# Patient Record
Sex: Female | Born: 1995
Health system: Southern US, Community
[De-identification: ages and names within clinical notes are randomized; demographics above are authoritative.]

## PROBLEM LIST (undated history)

## (undated) DIAGNOSIS — T783XXA Angioneurotic edema, initial encounter: Secondary | ICD-10-CM

## (undated) DIAGNOSIS — E079 Disorder of thyroid, unspecified: Secondary | ICD-10-CM

## (undated) DIAGNOSIS — L509 Urticaria, unspecified: Secondary | ICD-10-CM

## (undated) DIAGNOSIS — F419 Anxiety disorder, unspecified: Secondary | ICD-10-CM

## (undated) HISTORY — PX: WISDOM TOOTH EXTRACTION: SHX21

## (undated) HISTORY — DX: Urticaria, unspecified: L50.9

## (undated) HISTORY — DX: Angioneurotic edema, initial encounter: T78.3XXA

---
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
## 2016-02-28 ENCOUNTER — Emergency Department (HOSPITAL_COMMUNITY)
Admission: EM | Admit: 2016-02-28 | Discharge: 2016-02-29 | Disposition: A | Discharge status: Home / Self Care | Payer: 59 | Attending: Emergency Medicine | Admitting: Emergency Medicine

## 2016-02-28 ENCOUNTER — Encounter (HOSPITAL_COMMUNITY): Payer: Self-pay

## 2016-02-28 DIAGNOSIS — Y999 Unspecified external cause status: Secondary | ICD-10-CM | POA: Insufficient documentation

## 2016-02-28 DIAGNOSIS — Y939 Activity, unspecified: Secondary | ICD-10-CM | POA: Insufficient documentation

## 2016-02-28 DIAGNOSIS — Y929 Unspecified place or not applicable: Secondary | ICD-10-CM | POA: Insufficient documentation

## 2016-02-28 DIAGNOSIS — W57XXXA Bitten or stung by nonvenomous insect and other nonvenomous arthropods, initial encounter: Secondary | ICD-10-CM | POA: Insufficient documentation

## 2016-02-28 DIAGNOSIS — T22212A Burn of second degree of left forearm, initial encounter: Secondary | ICD-10-CM | POA: Insufficient documentation

## 2016-02-28 DIAGNOSIS — T2220XA Burn of second degree of shoulder and upper limb, except wrist and hand, unspecified site, initial encounter: Secondary | ICD-10-CM

## 2016-02-28 DIAGNOSIS — S50361A Insect bite (nonvenomous) of right elbow, initial encounter: Secondary | ICD-10-CM | POA: Insufficient documentation

## 2016-02-28 DIAGNOSIS — E039 Hypothyroidism, unspecified: Secondary | ICD-10-CM | POA: Insufficient documentation

## 2016-02-28 DIAGNOSIS — F17210 Nicotine dependence, cigarettes, uncomplicated: Secondary | ICD-10-CM | POA: Insufficient documentation

## 2016-02-28 MED ORDER — CEFUROXIME AXETIL 500 MG PO TABS: 500.0000 mg | ORAL_TABLET | Freq: Two times a day (BID) | ORAL | 0 refills | Status: DC

## 2016-02-28 MED ORDER — CEPHALEXIN 500 MG PO CAPS
500.0000 mg | ORAL_CAPSULE | Freq: Once | ORAL | Status: AC
  Administered 2016-02-28: 500 mg via ORAL
  Filled 2016-02-28: qty 1

## 2016-02-28 NOTE — Discharge Instructions (Signed)
Cleanse the burn wound with soap and water. Apply a Band-Aid for protection. Ceftin 2 times daily with food. Use benadryl cream if needed for itching.

## 2016-02-28 NOTE — ED Triage Notes (Signed)
Patient reports of waking up with insect bite to right forearm. Redness and heat noted. Also states she feels like she "cant get a full breath". Nad noted.

## 2016-02-28 NOTE — ED Notes (Signed)
Pt states a bug bite to the right forearm she noticed today. Redness noted to the right forearm, no drainage noted.

## 2016-02-29 NOTE — ED Notes (Signed)
Pt alert & oriented x4, stable gait. Patient  given discharge instructions, paperwork & prescription(s). Patient verbalized understanding. Pt left department w/ no further questions. 

## 2016-02-29 NOTE — ED Provider Notes (Signed)
[facility]-EMER[facility]PT Provider Note   CSN: [id number] Arrival date & time: 02/28/16  2204     History   Chief Complaint Chief Complaint  Patient presents with  . Insect Bite    HPI Donna Harmon is a 20 y.o. female.  Patient is a 20 year old female who presents to the emergency department with complaint of a insect bite to the right arm, and a burn to the left arm.  The patient states she sustained a burn to the left wrist 2 days ago. She sustained a bite to the right elbow area one day ago. She notices increased redness and swelling at the site of the bite and is concerned about infection area the patient states that prior to her arrival to the emergency department she felt as though she couldn't get a full breath and she became even more concerned. She's not had fever or chills reported. Patient presents now for assistance with these issues. Patient states that she is up-to-date on her tetanus.   The history is provided by the patient.    History reviewed. No pertinent past medical history.  Patient Active Problem List   Diagnosis Date Noted  . HYPOTHYROIDISM 08/01/2008  . THYROIDITIS 08/01/2008    History reviewed. No pertinent surgical history.  OB History    No data available       Home Medications    Prior to Admission medications   Medication Sig Start Date End Date Taking? Authorizing Provider  cefUROXime (CEFTIN) 500 MG tablet Take 1 tablet (500 mg total) by mouth 2 (two) times daily with a meal. 02/28/16   Ivery QualeHobson Rozelia Catapano, PA-C  predniSONE (DELTASONE) 50 MG tablet One tablet PO daily for 4 days 06/15/14   Zadie Rhineonald Wickline, MD    Family History No family history on file.  Social History Social History  Substance Use Topics  . Smoking status: Current Every Day Smoker    Packs/day: 0.50    Types: Cigarettes  . Smokeless tobacco: Never Used  . Alcohol use No     Allergies   Shrimp [shellfish allergy]   Review of Systems Review of Systems    Constitutional: Negative for activity change.       All ROS Neg except as noted in HPI  HENT: Negative for nosebleeds.   Eyes: Negative for photophobia and discharge.  Respiratory: Negative for cough, shortness of breath and wheezing.   Cardiovascular: Negative for chest pain and palpitations.  Gastrointestinal: Negative for abdominal pain and blood in stool.  Genitourinary: Negative for dysuria, frequency and hematuria.  Musculoskeletal: Negative for arthralgias, back pain and neck pain.  Skin: Negative.   Neurological: Negative for dizziness, seizures and speech difficulty.  Psychiatric/Behavioral: Negative for confusion and hallucinations.     Physical Exam Updated Vital Signs BP 107/67 (BP Location: Left Arm)   Pulse 71   Temp 98.6 F (37 C) (Oral)   Resp 20   Ht 5\' 4"  (1.626 m)   Wt 83.9 kg   LMP 02/25/2016   SpO2 100%   BMI 31.76 kg/m   Physical Exam  Constitutional: She is oriented to person, place, and time. She appears well-developed and well-nourished.  Non-toxic appearance.  HENT:  Head: Normocephalic.  Right Ear: Tympanic membrane and external ear normal.  Left Ear: Tympanic membrane and external ear normal.  Eyes: EOM and lids are normal. Pupils are equal, round, and reactive to light.  Neck: Normal range of motion. Neck supple. Carotid bruit is not present.  Cardiovascular: Normal rate,  regular rhythm, normal heart sounds, intact distal pulses and normal pulses.   Pulmonary/Chest: Breath sounds normal. No respiratory distress. She has no wheezes. She has no rales.  Abdominal: Soft. Bowel sounds are normal. There is no tenderness. There is no guarding.  Musculoskeletal: Normal range of motion.  There is a scabbed 2nd degree burn of the palmar surface of the left forearm. There is a red raised area of the right elbow ulnar surface. No red streaks. The area is warm but not hot. FROM of the right elbow. No palpable nodes of the right or left bicep/tricep area.   Lymphadenopathy:       Head (right side): No submandibular adenopathy present.       Head (left side): No submandibular adenopathy present.    She has no cervical adenopathy.  Neurological: She is alert and oriented to person, place, and time. She has normal strength. No cranial nerve deficit or sensory deficit.  Skin: Skin is warm and dry.  Psychiatric: She has a normal mood and affect. Her speech is normal.  Nursing note and vitals reviewed.    ED Treatments / Results  Labs (all labs ordered are listed, but only abnormal results are displayed) Labs Reviewed - No data to display  EKG  EKG Interpretation None       Radiology No results found.  Procedures Procedures (including critical care time)  Medications Ordered in ED Medications  cephALEXin (KEFLEX) capsule 500 mg (500 mg Oral Given 02/28/16 2359)     Initial Impression / Assessment and Plan / ED Course  I have reviewed the triage vital signs and the nursing notes.  Pertinent labs & imaging results that were available during my care of the patient were reviewed by me and considered in my medical decision making (see chart for details).  Clinical Course    *I have reviewed nursing notes, vital signs, and all appropriate lab and imaging results for this patient.**  Final Clinical Impressions(s) / ED Diagnoses  Pt sustained a burn to the left wrist area that is healing nicely. Pt has red raised warm insect bite of the right elbow. Pt to be treated with ceftin. Pt will use benadryl cream for itching if needed. Pt to return to ED if any changes or problem.   Final diagnoses:  Insect bite  Second degree burn of arm, initial encounter    New Prescriptions New Prescriptions   CEFUROXIME (CEFTIN) 500 MG TABLET    Take 1 tablet (500 mg total) by mouth 2 (two) times daily with a meal.     Ivery Quale, PA-C 02/29/16 0022    Devoria Albe, MD 02/29/16 [phone]

## 2016-04-09 ENCOUNTER — Ambulatory Visit: Payer: Self-pay | Admitting: Allergy & Immunology

## 2016-05-21 ENCOUNTER — Ambulatory Visit: Payer: 59 | Admitting: Allergy & Immunology

## 2016-08-22 DIAGNOSIS — R05 Cough: Secondary | ICD-10-CM | POA: Diagnosis not present

## 2016-08-22 DIAGNOSIS — J069 Acute upper respiratory infection, unspecified: Secondary | ICD-10-CM | POA: Diagnosis not present

## 2016-09-23 DIAGNOSIS — M9903 Segmental and somatic dysfunction of lumbar region: Secondary | ICD-10-CM | POA: Diagnosis not present

## 2016-09-23 DIAGNOSIS — M546 Pain in thoracic spine: Secondary | ICD-10-CM | POA: Diagnosis not present

## 2016-09-23 DIAGNOSIS — M9902 Segmental and somatic dysfunction of thoracic region: Secondary | ICD-10-CM | POA: Diagnosis not present

## 2016-09-25 DIAGNOSIS — M9902 Segmental and somatic dysfunction of thoracic region: Secondary | ICD-10-CM | POA: Diagnosis not present

## 2016-09-25 DIAGNOSIS — M546 Pain in thoracic spine: Secondary | ICD-10-CM | POA: Diagnosis not present

## 2016-09-25 DIAGNOSIS — M9903 Segmental and somatic dysfunction of lumbar region: Secondary | ICD-10-CM | POA: Diagnosis not present

## 2016-09-27 DIAGNOSIS — M9903 Segmental and somatic dysfunction of lumbar region: Secondary | ICD-10-CM | POA: Diagnosis not present

## 2016-09-27 DIAGNOSIS — M546 Pain in thoracic spine: Secondary | ICD-10-CM | POA: Diagnosis not present

## 2016-09-27 DIAGNOSIS — M9902 Segmental and somatic dysfunction of thoracic region: Secondary | ICD-10-CM | POA: Diagnosis not present

## 2016-09-30 DIAGNOSIS — M9902 Segmental and somatic dysfunction of thoracic region: Secondary | ICD-10-CM | POA: Diagnosis not present

## 2016-09-30 DIAGNOSIS — M546 Pain in thoracic spine: Secondary | ICD-10-CM | POA: Diagnosis not present

## 2016-09-30 DIAGNOSIS — M9903 Segmental and somatic dysfunction of lumbar region: Secondary | ICD-10-CM | POA: Diagnosis not present

## 2016-10-30 ENCOUNTER — Other Ambulatory Visit (HOSPITAL_COMMUNITY): Payer: Self-pay | Admitting: Preventative Medicine

## 2016-10-30 ENCOUNTER — Ambulatory Visit (HOSPITAL_COMMUNITY)
Admission: RE | Admit: 2016-10-30 | Discharge: 2016-10-30 | Disposition: A | Discharge status: Home / Self Care | Payer: Commercial Managed Care - HMO | Source: Ambulatory Visit | Attending: Preventative Medicine | Admitting: Preventative Medicine

## 2016-10-30 DIAGNOSIS — R109 Unspecified abdominal pain: Secondary | ICD-10-CM | POA: Diagnosis not present

## 2016-10-30 DIAGNOSIS — R1031 Right lower quadrant pain: Secondary | ICD-10-CM | POA: Insufficient documentation

## 2016-10-30 DIAGNOSIS — R935 Abnormal findings on diagnostic imaging of other abdominal regions, including retroperitoneum: Secondary | ICD-10-CM | POA: Diagnosis not present

## 2016-10-30 MED ORDER — IOPAMIDOL (ISOVUE-300) INJECTION 61%
100.0000 mL | Freq: Once | INTRAVENOUS | Status: AC | PRN
  Administered 2016-10-30: 100 mL via INTRAVENOUS

## 2016-11-06 DIAGNOSIS — Z01419 Encounter for gynecological examination (general) (routine) without abnormal findings: Secondary | ICD-10-CM | POA: Diagnosis not present

## 2016-11-15 DIAGNOSIS — Z0389 Encounter for observation for other suspected diseases and conditions ruled out: Secondary | ICD-10-CM | POA: Diagnosis not present

## 2016-12-11 DIAGNOSIS — E039 Hypothyroidism, unspecified: Secondary | ICD-10-CM | POA: Diagnosis not present

## 2016-12-27 ENCOUNTER — Emergency Department (HOSPITAL_COMMUNITY)
Admission: EM | Admit: 2016-12-27 | Discharge: 2016-12-27 | Disposition: A | Discharge status: Home / Self Care | Payer: 59 | Attending: Emergency Medicine | Admitting: Emergency Medicine

## 2016-12-27 ENCOUNTER — Encounter (HOSPITAL_COMMUNITY): Payer: Self-pay | Admitting: *Deleted

## 2016-12-27 DIAGNOSIS — Y93K9 Activity, other involving animal care: Secondary | ICD-10-CM | POA: Insufficient documentation

## 2016-12-27 DIAGNOSIS — E039 Hypothyroidism, unspecified: Secondary | ICD-10-CM | POA: Diagnosis not present

## 2016-12-27 DIAGNOSIS — S61250A Open bite of right index finger without damage to nail, initial encounter: Secondary | ICD-10-CM | POA: Diagnosis not present

## 2016-12-27 DIAGNOSIS — Z23 Encounter for immunization: Secondary | ICD-10-CM | POA: Diagnosis not present

## 2016-12-27 DIAGNOSIS — F17210 Nicotine dependence, cigarettes, uncomplicated: Secondary | ICD-10-CM | POA: Diagnosis not present

## 2016-12-27 DIAGNOSIS — W5311XA Bitten by rat, initial encounter: Secondary | ICD-10-CM | POA: Insufficient documentation

## 2016-12-27 DIAGNOSIS — Y92410 Unspecified street and highway as the place of occurrence of the external cause: Secondary | ICD-10-CM | POA: Insufficient documentation

## 2016-12-27 DIAGNOSIS — Z79899 Other long term (current) drug therapy: Secondary | ICD-10-CM | POA: Diagnosis not present

## 2016-12-27 DIAGNOSIS — Y999 Unspecified external cause status: Secondary | ICD-10-CM | POA: Insufficient documentation

## 2016-12-27 DIAGNOSIS — S61431A Puncture wound without foreign body of right hand, initial encounter: Secondary | ICD-10-CM | POA: Insufficient documentation

## 2016-12-27 HISTORY — DX: Disorder of thyroid, unspecified: E07.9

## 2016-12-27 MED ORDER — AMOXICILLIN-POT CLAVULANATE 875-125 MG PO TABS
1.0000 | ORAL_TABLET | Freq: Once | ORAL | Status: AC
  Administered 2016-12-27: 1 via ORAL
  Filled 2016-12-27: qty 1

## 2016-12-27 MED ORDER — AMOXICILLIN-POT CLAVULANATE 875-125 MG PO TABS: 1.0000 | ORAL_TABLET | Freq: Two times a day (BID) | ORAL | 0 refills | Status: DC

## 2016-12-27 MED ORDER — TETANUS-DIPHTH-ACELL PERTUSSIS 5-2.5-18.5 LF-MCG/0.5 IM SUSP
0.5000 mL | Freq: Once | INTRAMUSCULAR | Status: AC
  Administered 2016-12-27: 0.5 mL via INTRAMUSCULAR
  Filled 2016-12-27: qty 0.5

## 2016-12-27 NOTE — ED Triage Notes (Signed)
Pt was bit on her right index finger by a "feeder rat" that she was feeding to her snake.

## 2016-12-27 NOTE — ED Provider Notes (Signed)
[facility] Provider Note   CSN: [id number] Arrival date & time: 12/27/16  2107     History   Chief Complaint Chief Complaint  Patient presents with  . Animal Bite    HPI Donna Harmon is a 21 y.o. female presenting for evaluation of rat bite to her right index finger.  She had purchased "feeder rats" to feed her snake from a pet store in [place] and driving home one got out of the box and bit her finger. She has one puncture wound that bled copiously.  She cleaned it with rubbing alcohol prior to arriving here.  She is concerned about possible infection.  She is unsure of her tetanus status.  .  The history is provided by the patient.    Past Medical History:  Diagnosis Date  . Thyroid disease     Patient Active Problem List   Diagnosis Date Noted  . HYPOTHYROIDISM 08/01/2008  . THYROIDITIS 08/01/2008    History reviewed. No pertinent surgical history.  OB History    No data available       Home Medications    Prior to Admission medications   Medication Sig Start Date End Date Taking? Authorizing Provider  amoxicillin-clavulanate (AUGMENTIN) 875-125 MG tablet Take 1 tablet by mouth every 12 (twelve) hours. 12/27/16   Burgess AmorIdol, Hillis Mcphatter, PA-C  cefUROXime (CEFTIN) 500 MG tablet Take 1 tablet (500 mg total) by mouth 2 (two) times daily with a meal. 02/28/16   Ivery QualeBryant, Hobson, PA-C  predniSONE (DELTASONE) 50 MG tablet One tablet PO daily for 4 days 06/15/14   Zadie RhineWickline, Donald, MD    Family History History reviewed. No pertinent family history.  Social History Social History  Substance Use Topics  . Smoking status: Current Every Day Smoker    Packs/day: 0.50    Types: Cigarettes  . Smokeless tobacco: Never Used  . Alcohol use No     Allergies   Shrimp [shellfish allergy]   Review of Systems Review of Systems  Constitutional: Negative for chills and fever.  Respiratory: Negative for shortness of breath and wheezing.   Skin: Positive for wound.    Neurological: Negative for numbness.     Physical Exam Updated Vital Signs BP 119/78   Pulse 97   Temp 97.7 F (36.5 C) (Oral)   Resp 17   Ht 5\' 4"  (1.626 m)   Wt 81.6 kg (180 lb)   SpO2 99%   BMI 30.90 kg/m   Physical Exam  Constitutional: She is oriented to person, place, and time. She appears well-developed and well-nourished.  HENT:  Head: Normocephalic.  Cardiovascular: Normal rate.   Pulmonary/Chest: Effort normal.  Musculoskeletal: She exhibits no tenderness.  FROM of fingers. No ttp of affected finger.  Neurological: She is alert and oriented to person, place, and time. No sensory deficit.  Skin:  Tiny puncture wound right distal index finger, hemostatic.     ED Treatments / Results  Labs (all labs ordered are listed, but only abnormal results are displayed) Labs Reviewed - No data to display  EKG  EKG Interpretation None       Radiology No results found.  Procedures Procedures (including critical care time)  Medications Ordered in ED Medications  Tdap (BOOSTRIX) injection 0.5 mL (0.5 mLs Intramuscular Given 12/27/16 2158)  amoxicillin-clavulanate (AUGMENTIN) 875-125 MG per tablet 1 tablet (1 tablet Oral Given 12/27/16 2157)     Initial Impression / Assessment and Plan / ED Course  I have reviewed the triage vital signs  and the nursing notes.  Pertinent labs & imaging results that were available during my care of the patient were reviewed by me and considered in my medical decision making (see chart for details).     Pt will be covered with augmentin given animal bite. Tetanus updated.  PRN f/u anticipated.  No indication to consider rabies as rats do not harbor per cdc review.  Final Clinical Impressions(s) / ED Diagnoses   Final diagnoses:  Rat bite, initial encounter    New Prescriptions New Prescriptions   AMOXICILLIN-CLAVULANATE (AUGMENTIN) 875-125 MG TABLET    Take 1 tablet by mouth every 12 (twelve) hours.     Hoyle Barkdull,  PA-C 12/27/16 23Burgess [id number]    Donnetta Hutchingook, Brian, MD 01/02/17 [phone]

## 2017-01-09 DIAGNOSIS — E039 Hypothyroidism, unspecified: Secondary | ICD-10-CM | POA: Diagnosis not present

## 2017-01-10 DIAGNOSIS — Z048 Encounter for examination and observation for other specified reasons: Secondary | ICD-10-CM | POA: Diagnosis not present

## 2017-01-17 DIAGNOSIS — Z0100 Encounter for examination of eyes and vision without abnormal findings: Secondary | ICD-10-CM | POA: Diagnosis not present

## 2017-01-21 ENCOUNTER — Encounter: Payer: 59 | Admitting: Adult Health

## 2017-01-28 ENCOUNTER — Telehealth: Payer: Self-pay | Admitting: *Deleted

## 2017-01-28 NOTE — Telephone Encounter (Signed)
Pt called in with some gyn problems.  Pt given appointment for problem since we have never seen her before.  01-28-17  AS

## 2017-01-29 DIAGNOSIS — N912 Amenorrhea, unspecified: Secondary | ICD-10-CM | POA: Diagnosis not present

## 2017-02-10 ENCOUNTER — Other Ambulatory Visit (HOSPITAL_COMMUNITY): Payer: Self-pay | Admitting: Obstetrics and Gynecology

## 2017-02-10 ENCOUNTER — Encounter: Payer: 59 | Admitting: Adult Health

## 2017-02-10 DIAGNOSIS — Z3141 Encounter for fertility testing: Secondary | ICD-10-CM

## 2017-02-14 ENCOUNTER — Ambulatory Visit (HOSPITAL_COMMUNITY): Admission: RE | Admit: 2017-02-14 | Payer: 59 | Source: Ambulatory Visit

## 2017-02-25 ENCOUNTER — Emergency Department (HOSPITAL_COMMUNITY): Payer: 59

## 2017-02-25 ENCOUNTER — Emergency Department (HOSPITAL_COMMUNITY)
Admission: EM | Admit: 2017-02-25 | Discharge: 2017-02-25 | Disposition: A | Discharge status: Home / Self Care | Payer: 59 | Attending: Emergency Medicine | Admitting: Emergency Medicine

## 2017-02-25 ENCOUNTER — Encounter (HOSPITAL_COMMUNITY): Payer: Self-pay | Admitting: *Deleted

## 2017-02-25 DIAGNOSIS — R0600 Dyspnea, unspecified: Secondary | ICD-10-CM | POA: Diagnosis not present

## 2017-02-25 DIAGNOSIS — Z79899 Other long term (current) drug therapy: Secondary | ICD-10-CM | POA: Insufficient documentation

## 2017-02-25 DIAGNOSIS — R0602 Shortness of breath: Secondary | ICD-10-CM | POA: Diagnosis not present

## 2017-02-25 DIAGNOSIS — E039 Hypothyroidism, unspecified: Secondary | ICD-10-CM | POA: Insufficient documentation

## 2017-02-25 DIAGNOSIS — F17210 Nicotine dependence, cigarettes, uncomplicated: Secondary | ICD-10-CM | POA: Diagnosis not present

## 2017-02-25 HISTORY — DX: Anxiety disorder, unspecified: F41.9

## 2017-02-25 LAB — CBC WITH DIFFERENTIAL/PLATELET
BASOS ABS: 0 10*3/uL (ref 0.0–0.1)
BASOS PCT: 0 %
EOS ABS: 0.3 10*3/uL (ref 0.0–0.7)
EOS PCT: 3 %
HCT: 43.8 % (ref 36.0–46.0)
Hemoglobin: 14.6 g/dL (ref 12.0–15.0)
Lymphocytes Relative: 42 %
Lymphs Abs: 4 10*3/uL (ref 0.7–4.0)
MCH: 30.6 pg (ref 26.0–34.0)
MCHC: 33.3 g/dL (ref 30.0–36.0)
MCV: 91.8 fL (ref 78.0–100.0)
Monocytes Absolute: 1 10*3/uL (ref 0.1–1.0)
Monocytes Relative: 10 %
Neutro Abs: 4.3 10*3/uL (ref 1.7–7.7)
Neutrophils Relative %: 45 %
PLATELETS: 278 10*3/uL (ref 150–400)
RBC: 4.77 MIL/uL (ref 3.87–5.11)
RDW: 12.6 % (ref 11.5–15.5)
WBC: 9.5 10*3/uL (ref 4.0–10.5)

## 2017-02-25 LAB — D-DIMER, QUANTITATIVE (NOT AT ARMC)

## 2017-02-25 LAB — BASIC METABOLIC PANEL
Anion gap: 8 (ref 5–15)
BUN: 9 mg/dL (ref 6–20)
CO2: 26 mmol/L (ref 22–32)
Calcium: 9.4 mg/dL (ref 8.9–10.3)
Chloride: 105 mmol/L (ref 101–111)
Creatinine, Ser: 0.77 mg/dL (ref 0.44–1.00)
GFR calc Af Amer: >60 mL/min (ref >60)
GLUCOSE: 112 mg/dL — AB (ref 65–99)
POTASSIUM: 3.4 mmol/L — AB (ref 3.5–5.1)
Sodium: 139 mmol/L (ref 135–145)

## 2017-02-25 LAB — HCG, SERUM, QUALITATIVE: Preg, Serum: NEGATIVE

## 2017-02-25 NOTE — ED Triage Notes (Signed)
Pt c/o some tightness to her throat that started last night and has gotten progressively worse; pt states she has not taken her lexapro in two days

## 2017-02-25 NOTE — Discharge Instructions (Signed)
Continue your medications as previously prescribed.  Return to the emergency department if your symptoms significantly worsen or change.

## 2017-02-25 NOTE — ED Provider Notes (Signed)
[facility] DEPT Provider Note   CSN: [id number] Arrival date & time: 02/25/17  0152     History   Chief Complaint Chief Complaint  Patient presents with  . Shortness of Breath    HPI Donna Harmon is a 21 y.o. female.  Patient is a 21 year old female with past history of anxiety. She presents today for evaluation of shortness of breath. She had an episode yesterday evening, and another one again this evening. She states she was lying in bed when she developed the sensation that she could not get enough air. She denies any chest pain. She denies any leg pain or swelling. She denies any fevers, chills, or productive cough.   The history is provided by the patient.  Shortness of Breath  This is a new problem. The average episode lasts 2 hours. The problem occurs continuously.Pertinent negatives include no fever, no cough, no sputum production, no chest pain, no leg pain and no leg swelling. She has tried nothing for the symptoms. Associated medical issues do not include asthma, COPD, PE or DVT.    Past Medical History:  Diagnosis Date  . Anxiety   . Thyroid disease     Patient Active Problem List   Diagnosis Date Noted  . HYPOTHYROIDISM 08/01/2008  . THYROIDITIS 08/01/2008    Past Surgical History:  Procedure Laterality Date  . WISDOM TOOTH EXTRACTION      OB History    No data available       Home Medications    Prior to Admission medications   Medication Sig Start Date End Date Taking? Authorizing Provider  escitalopram (LEXAPRO) 10 MG tablet Take 10 mg by mouth daily.   Yes [provider]  levothyroxine (SYNTHROID, LEVOTHROID) 75 MCG tablet Take 75 mcg by mouth daily before breakfast.   Yes [provider]    Family History History reviewed. No pertinent family history.  Social History Social History  Substance Use Topics  . Smoking status: Current Every Day Smoker    Packs/day: 0.50    Types: Cigarettes  . Smokeless tobacco:  Never Used  . Alcohol use No     Allergies   Shrimp [shellfish allergy]   Review of Systems Review of Systems  Constitutional: Negative for fever.  Respiratory: Positive for shortness of breath. Negative for cough and sputum production.   Cardiovascular: Negative for chest pain and leg swelling.  All other systems reviewed and are negative.    Physical Exam Updated Vital Signs BP 124/73 (BP Location: Left Arm)   Pulse 83   Temp 97.6 F (36.4 C) (Oral)   Resp 18   Ht  (1.626 m)   Wt 85.3 kg (188 lb)   LMP 02/14/2017   SpO2 98%   BMI 32.27 kg/m   Physical Exam  Constitutional: She is oriented to person, place, and time. She appears well-developed and well-nourished. No distress.  HENT:  Head: Normocephalic and atraumatic.  Neck: Normal range of motion. Neck supple.  Cardiovascular: Normal rate and regular rhythm.  Exam reveals no gallop and no friction rub.   No murmur heard. Pulmonary/Chest: Effort normal and breath sounds normal. No respiratory distress. She has no wheezes.  Abdominal: Soft. Bowel sounds are normal. She exhibits no distension. There is no tenderness.  Musculoskeletal: Normal range of motion. She exhibits no edema.  There is no calf swelling or tenderness. Homans sign is absent bilaterally.  Neurological: She is alert and oriented to person, place, and time.  Skin: Skin  is warm and dry. She is not diaphoretic.  Nursing note and vitals reviewed.    ED Treatments / Results  Labs (all labs ordered are listed, but only abnormal results are displayed) Labs Reviewed  BASIC METABOLIC PANEL  CBC WITH DIFFERENTIAL/PLATELET  D-DIMER, QUANTITATIVE (NOT AT [facility])  HCG, SERUM, QUALITATIVE    EKG  EKG Interpretation None       Radiology No results found.  Procedures Procedures (including critical care time)  Medications Ordered in ED Medications - No data to display   Initial Impression / Assessment and Plan / ED Course  I have  reviewed the triage vital signs and the nursing notes.  Pertinent labs & imaging results that were available during my care of the patient were reviewed by me and considered in my medical decision making (see chart for details).  Patient presents with complaints of difficulty breathing. She appears in no respiratory distress, there is no hypoxia, and her lungs are clear. Chest x-ray reveals no acute abnormality, her d-dimer is negative, and there is no anemia. I am uncertain as to the etiology of her dyspnea, however nothing today appears emergent. She does report not taking her Lexapro for the last 2 days. It is possible this may be related to stopping this medication. She also has a history of anxiety and there may well be an anxiety component.  Either way, I see nothing emergent that requires further workup or admission. She will be discharged, to follow-up as needed.  Final Clinical Impressions(s) / ED Diagnoses   Final diagnoses:  None    New Prescriptions New Prescriptions   No medications on file     Geoffery Lyons, MD 02/25/17 [phone]

## 2017-03-19 DIAGNOSIS — J028 Acute pharyngitis due to other specified organisms: Secondary | ICD-10-CM | POA: Diagnosis not present

## 2017-03-19 DIAGNOSIS — E039 Hypothyroidism, unspecified: Secondary | ICD-10-CM | POA: Diagnosis not present

## 2017-04-16 DIAGNOSIS — T7802XD Anaphylactic reaction due to shellfish (crustaceans), subsequent encounter: Secondary | ICD-10-CM | POA: Diagnosis not present

## 2017-04-16 DIAGNOSIS — H6090 Unspecified otitis externa, unspecified ear: Secondary | ICD-10-CM | POA: Diagnosis not present

## 2017-04-16 DIAGNOSIS — E039 Hypothyroidism, unspecified: Secondary | ICD-10-CM | POA: Diagnosis not present

## 2017-04-27 DIAGNOSIS — R062 Wheezing: Secondary | ICD-10-CM | POA: Diagnosis not present

## 2017-04-27 DIAGNOSIS — J069 Acute upper respiratory infection, unspecified: Secondary | ICD-10-CM | POA: Diagnosis not present

## 2017-04-27 DIAGNOSIS — R05 Cough: Secondary | ICD-10-CM | POA: Diagnosis not present

## 2017-04-27 DIAGNOSIS — N926 Irregular menstruation, unspecified: Secondary | ICD-10-CM | POA: Diagnosis not present

## 2017-07-10 DIAGNOSIS — R05 Cough: Secondary | ICD-10-CM | POA: Diagnosis not present

## 2017-07-10 DIAGNOSIS — G43009 Migraine without aura, not intractable, without status migrainosus: Secondary | ICD-10-CM | POA: Diagnosis not present

## 2017-07-10 DIAGNOSIS — R509 Fever, unspecified: Secondary | ICD-10-CM | POA: Diagnosis not present

## 2017-08-20 DIAGNOSIS — R102 Pelvic and perineal pain: Secondary | ICD-10-CM | POA: Diagnosis not present

## 2017-08-20 DIAGNOSIS — E039 Hypothyroidism, unspecified: Secondary | ICD-10-CM | POA: Diagnosis not present

## 2017-08-22 DIAGNOSIS — R9389 Abnormal findings on diagnostic imaging of other specified body structures: Secondary | ICD-10-CM | POA: Diagnosis not present

## 2017-08-22 DIAGNOSIS — R102 Pelvic and perineal pain: Secondary | ICD-10-CM | POA: Diagnosis not present

## 2017-08-31 DIAGNOSIS — G43911 Migraine, unspecified, intractable, with status migrainosus: Secondary | ICD-10-CM | POA: Diagnosis not present

## 2017-08-31 DIAGNOSIS — B349 Viral infection, unspecified: Secondary | ICD-10-CM | POA: Diagnosis not present

## 2017-08-31 DIAGNOSIS — F17200 Nicotine dependence, unspecified, uncomplicated: Secondary | ICD-10-CM | POA: Diagnosis not present

## 2017-10-06 DIAGNOSIS — H6503 Acute serous otitis media, bilateral: Secondary | ICD-10-CM | POA: Diagnosis not present

## 2017-10-21 DIAGNOSIS — E039 Hypothyroidism, unspecified: Secondary | ICD-10-CM | POA: Diagnosis not present

## 2017-10-21 DIAGNOSIS — L700 Acne vulgaris: Secondary | ICD-10-CM | POA: Diagnosis not present

## 2017-10-21 DIAGNOSIS — N911 Secondary amenorrhea: Secondary | ICD-10-CM | POA: Diagnosis not present

## 2017-12-29 DIAGNOSIS — S338XXA Sprain of other parts of lumbar spine and pelvis, initial encounter: Secondary | ICD-10-CM | POA: Diagnosis not present

## 2017-12-29 DIAGNOSIS — S93402A Sprain of unspecified ligament of left ankle, initial encounter: Secondary | ICD-10-CM | POA: Diagnosis not present

## 2018-01-16 IMAGING — CT CT ABD-PELV W/ CM
2 of 4 series · 16 of 46 positions shown, 18 images · IV contrast (Isovue)
Comparison: None.

CLINICAL DATA: Right lower quadrant abdominal pain with rebound
tenderness since yesterday.

EXAM:
CT ABDOMEN AND PELVIS WITH CONTRAST
TECHNIQUE: Multidetector CT imaging of the abdomen and pelvis was performed
using the standard protocol following bolus administration of
intravenous contrast.
CONTRAST:  100mL Z37ZSP-WPP IOPAMIDOL (Z37ZSP-WPP) INJECTION 61%

[Series 2: axial st · axial · 0.85mm/px · z∈[+1124,+1554]mm · 13 of 94 slices shown, 15 images]
[im 4/94  soft-tissue]
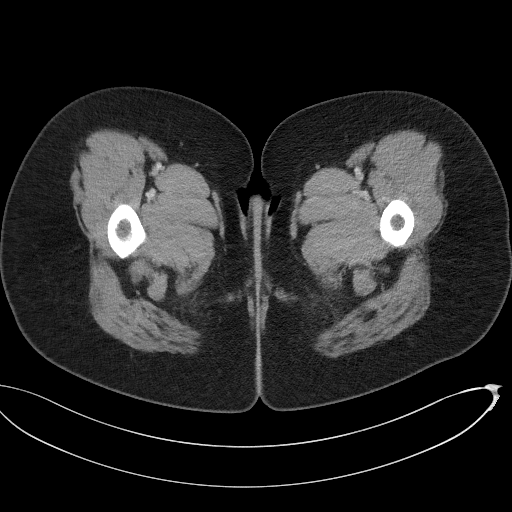
[im 4/94  bone]
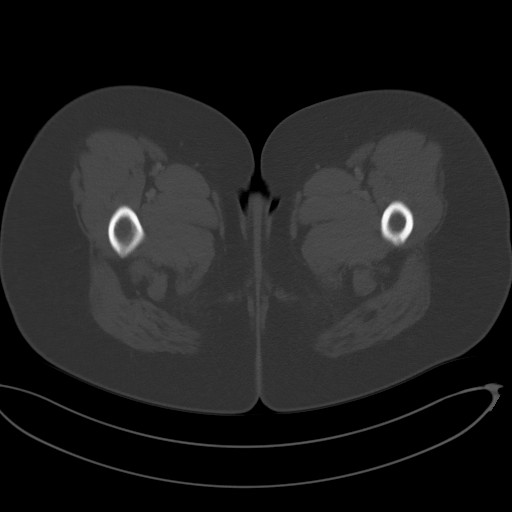
[im 12/94  soft-tissue]
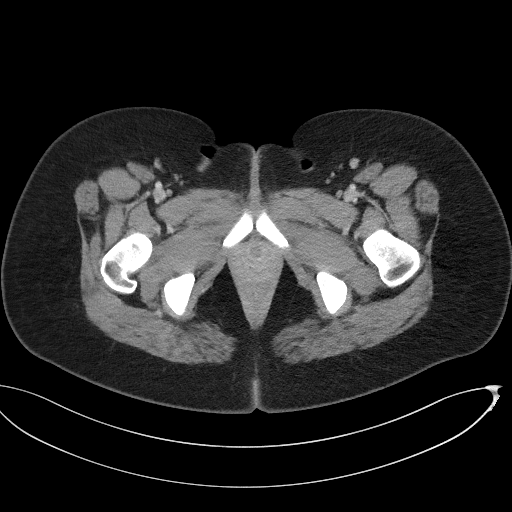
[im 19/94  soft-tissue]
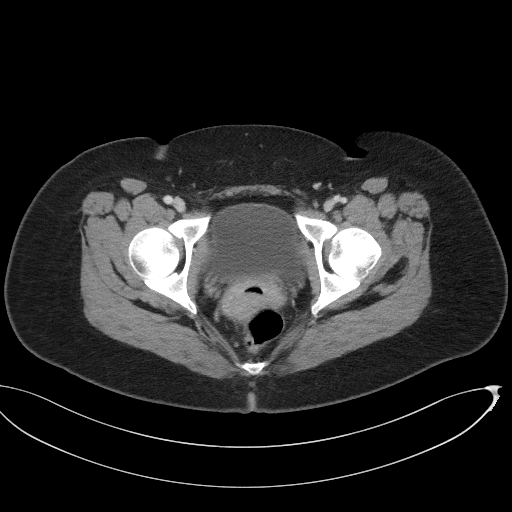
[im 27/94  soft-tissue]
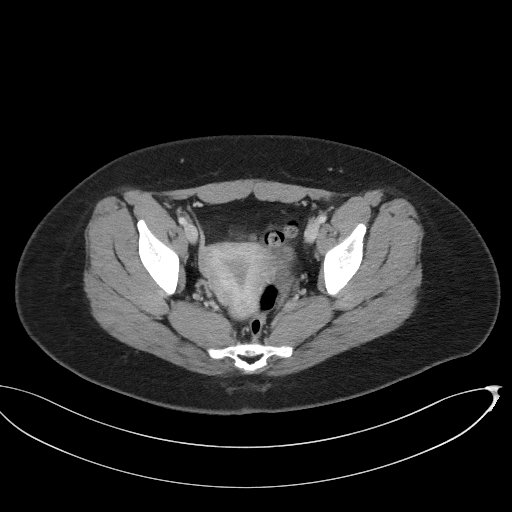
[im 34/94  soft-tissue]
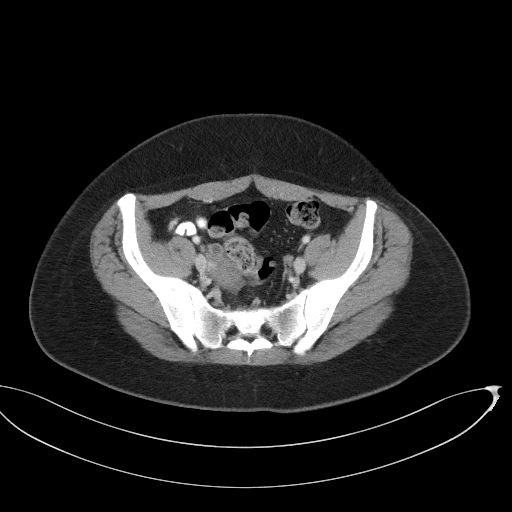
[im 41/94  soft-tissue]
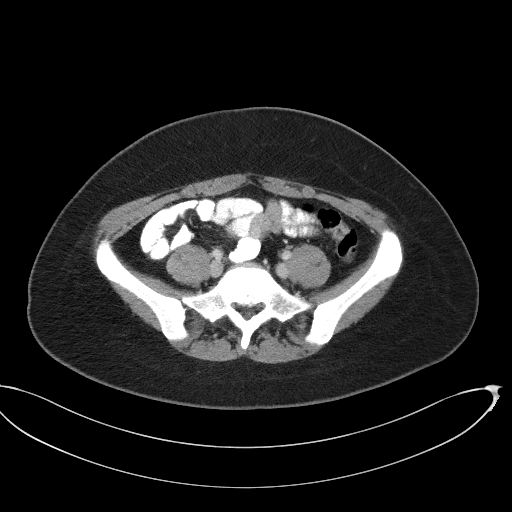
[im 49/94  soft-tissue]
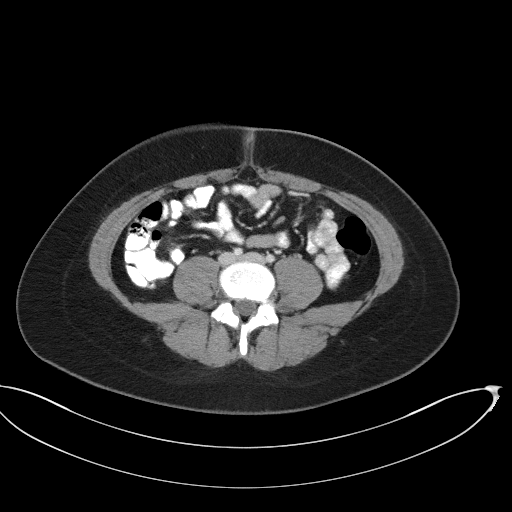
[im 53/94  soft-tissue]
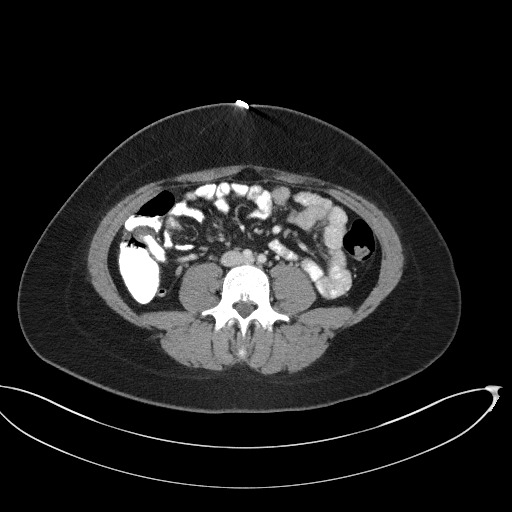
[im 60/94  soft-tissue]
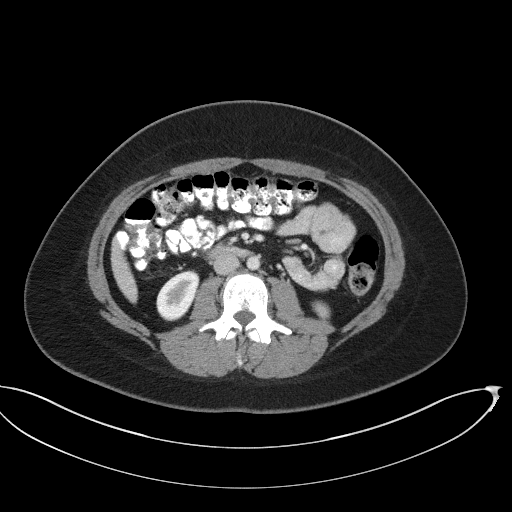
[im 60/94  bone]
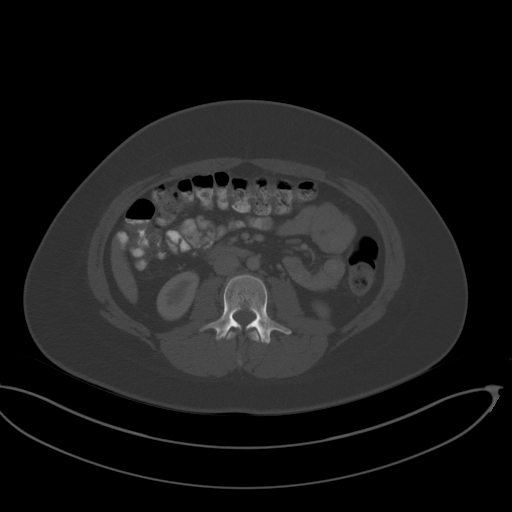
[im 67/94  soft-tissue]
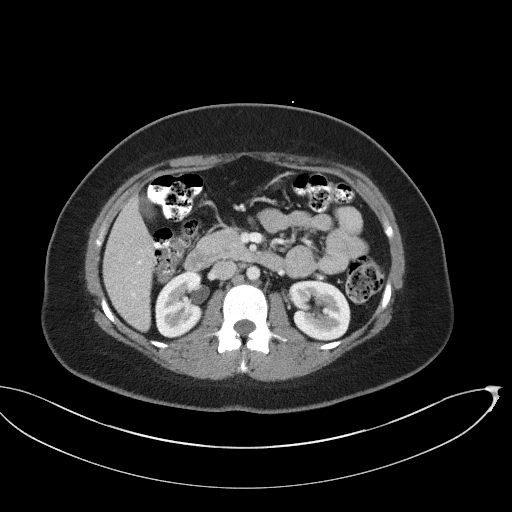
[im 75/94  soft-tissue]
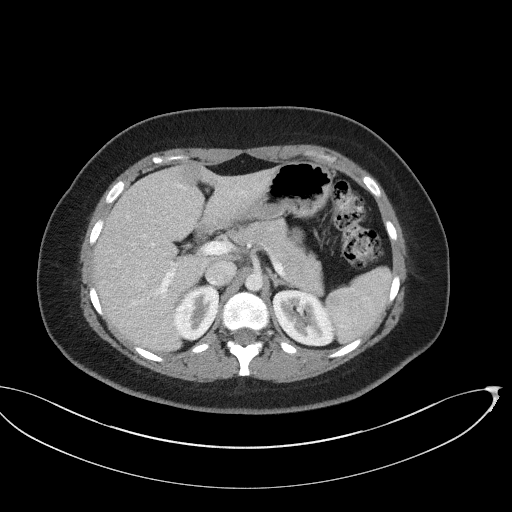
[im 82/94  soft-tissue]
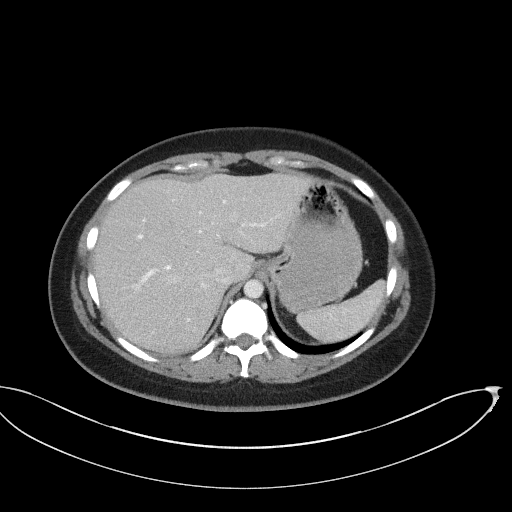
[im 90/94  soft-tissue]
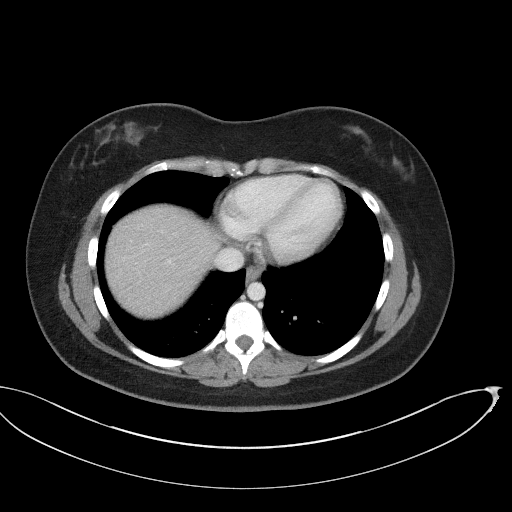

[Series 5: coronal st · coronal · 0.81mm/px · 3 of 94 slices shown]
[im 32/94  soft-tissue]
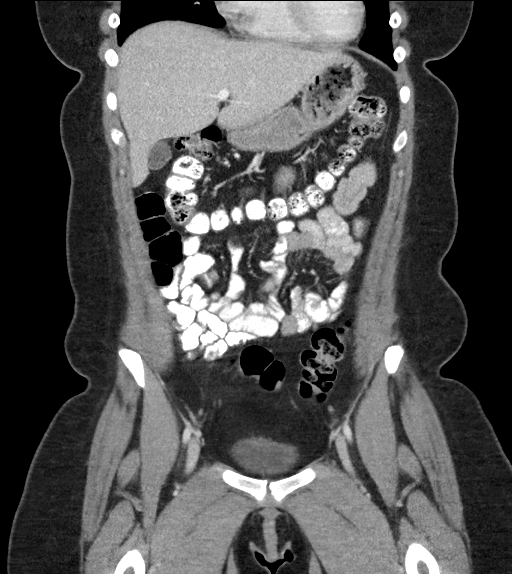
[im 42/94  soft-tissue]
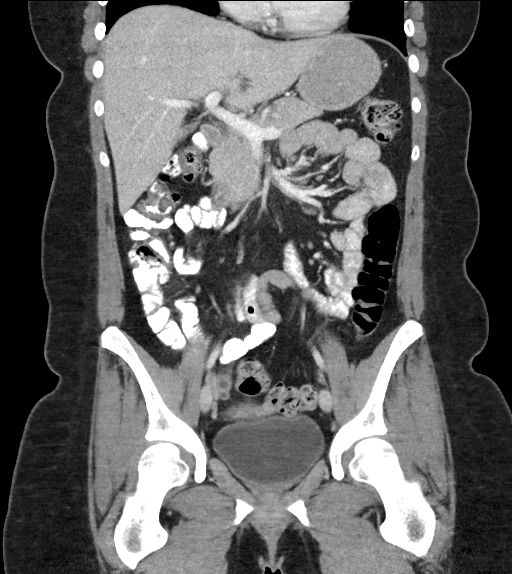
[im 52/94  soft-tissue]
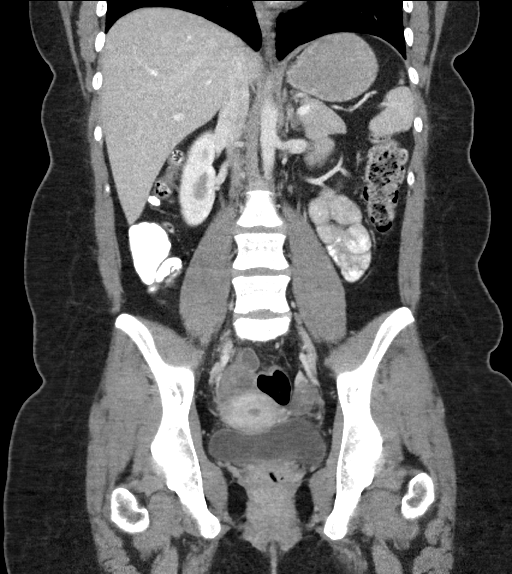

[16 of 46 positions shown; findings below may reference images not displayed]

FINDINGS: Lower chest: No significant pulmonary nodules or acute consolidative
airspace disease.

Hepatobiliary: Normal liver with no liver mass. Normal gallbladder
with no radiopaque cholelithiasis. No biliary ductal dilatation.

Pancreas: Normal, with no mass or duct dilation.

Spleen: Normal size. No mass.

Adrenals/Urinary Tract: Normal adrenals. Normal kidneys with no
hydronephrosis and no renal mass. Normal bladder.

Stomach/Bowel: Grossly normal stomach. Normal caliber small bowel
with no small bowel wall thickening. Normal appendix. Oral contrast
is seen within the lumen of the appendix. Normal large bowel with no
diverticulosis, large bowel wall thickening or pericolonic fat
stranding.

Vascular/Lymphatic: Normal caliber abdominal aorta. Patent portal,
splenic, hepatic and renal veins. No pathologically enlarged lymph
nodes in the abdomen or pelvis.

Reproductive: Normal size anteverted uterus. Low-attenuation in the
upper cervix is nonspecific. Tubular cystic structure in the right
adnexa (series 5/image 45) is compatible with a mild right
hydrosalpinx and demonstrates mild wall thickening. Low-attenuation
2.2 x 1.6 cm right ovarian lesion (series 2/image 63). No left
adnexal mass.

Other: No pneumoperitoneum, ascites or focal fluid collection.

Musculoskeletal: No aggressive appearing focal osseous lesions.
IMPRESSION: 1. Normal appendix. No evidence of bowel obstruction or acute bowel
inflammation.
2. Mildly thick walled tubular right adnexal cystic structure
compatible with a mild right hydrosalpinx, with acute right
salpingitis/pelvic inflammatory disease not excluded.
3. Low-attenuation 2.2 cm right ovarian structure is likely to
represent a right ovarian hemorrhagic cyst. A follow-up
transabdominal and transvaginal pelvic ultrasound study is
recommended in 2-3 menstrual cycles to document resolution (to
exclude an endometrioma).
4. Nonspecific focal low-attenuation in the upper cervix, probably
due to nabothian cysts. Please ensure that the patient is current
with Pap smear screening.

## 2018-01-17 DIAGNOSIS — R21 Rash and other nonspecific skin eruption: Secondary | ICD-10-CM | POA: Diagnosis not present

## 2018-03-16 DIAGNOSIS — T783XXA Angioneurotic edema, initial encounter: Secondary | ICD-10-CM | POA: Diagnosis not present

## 2018-04-02 DIAGNOSIS — B373 Candidiasis of vulva and vagina: Secondary | ICD-10-CM | POA: Diagnosis not present

## 2018-04-02 DIAGNOSIS — Z6833 Body mass index (BMI) 33.0-33.9, adult: Secondary | ICD-10-CM | POA: Diagnosis not present

## 2018-04-08 ENCOUNTER — Ambulatory Visit: Payer: 59 | Admitting: Allergy & Immunology

## 2018-04-08 ENCOUNTER — Encounter: Payer: Self-pay | Admitting: Allergy & Immunology

## 2018-04-08 VITALS — BP 102/60 | HR 98 | Temp 98.2°F | Resp 18 | Ht 64.5 in | Wt 198.0 lb

## 2018-04-08 DIAGNOSIS — L508 Other urticaria: Secondary | ICD-10-CM

## 2018-04-08 DIAGNOSIS — J3089 Other allergic rhinitis: Secondary | ICD-10-CM

## 2018-04-08 NOTE — Progress Notes (Signed)
NEW PATIENT  Date of Service/Encounter:  04/08/18  Referring provider: Selinda Flavin, MD   Assessment:   Chronic idiopathic urticaria  Perennial allergic rhinitis (dust mites, cat)  Plan/Recommendations:   1. Chronic urticaria - Your history does not have any "red flags" such as fevers, joint pains, or permanent skin changes that would be concerning for a more serious cause of hives.  - We will get some labs to rule out serious causes of hives: complete blood count, tryptase level, CMP,  and alpha gal panel. - Chronic hives are often times a self limited process and will "burn themselves out" over 6-12 months, although this is not always the case.  - In the meantime, start suppressive dosing of antihistamines:   - Morning: Zyrtec (cetirizine) 10-20mg  (one to two tablets)  - Evening: Zyrtec (cetirizine) 10-20mg  (one to two tablets)  - If the above is not working, try adding: Zantac (ranitidine) 300mg  (two tablets) - You can change this dosing at home, decreasing the dose as needed or increasing the dosing as needed.  - If you are not tolerating the medications or are tired of taking them every day, we can start treatment with a monthly injectable medication called Xolair.   2. Perennial allergic rhinitis - Testing today showed: dust mites and cat - Avoidance measures provided. - Start taking: antihistamines as above (Allegra and Zyrtec). - You can use an extra dose of the antihistamine, if needed, for breakthrough symptoms.  - Consider nasal saline rinses 1-2 times daily to remove allergens from the nasal cavities as well as help with mucous clearance.  3. Return in about 6 weeks (around 05/20/2018).  Subjective:   Donna Harmon is a 22 y.o. female presenting today for evaluation of  Chief Complaint  Patient presents with  . Angioedema    oral & lip. ?paprika or cumin? shrimp? has had hives with the swelling. this all occurred a couple of weeks ago. has had cooked shrimp  with no issues. Dad is with her today.     Donna Harmon has a history of the following: Patient Active Problem List   Diagnosis Date Noted  . Chronic urticaria 04/08/2018  . Perennial allergic rhinitis 04/08/2018  . HYPOTHYROIDISM 08/01/2008  . THYROIDITIS 08/01/2008    History obtained from: chart review and patient and her father.  Donna Harmon was referred by Selinda Flavin, MD.     Donna Harmon is a 22 y.o. female presenting for evaluation of angioedema and urticaria. She was around paprika and cumin at the time. She ate at a [employer] and chili and beans at another time. She did have [place] chili last night and was fine. She does report that she has hives nearly every day. She is itchy throughout the day. She did treat the lip swelling with prednisone and "something else" for itching from Urgent Care. She did get an IM injection of steroid with improvement over the rest of the day.   She tolerates all of the major food allergens without adverse event. She does have red meat multiples times per week, mostly pork. There was another episode years ago. At the time, she was also handling raw shrimp. She had swelling on the top of her lip. She reports that she eats cooked shrimp without a problem. She does not like fish or eggs, but she has never had a bad reaction to them.   She does not have a history of asthma. She has never needed an inhaler at  all. Otherwise, there is no history of other atopic diseases, including asthma, drug allergies, stinging insect allergies or eczema. There is no significant infectious history. Vaccinations are up to date.    Past Medical History: Patient Active Problem List   Diagnosis Date Noted  . Chronic urticaria 04/08/2018  . Perennial allergic rhinitis 04/08/2018  . HYPOTHYROIDISM 08/01/2008  . THYROIDITIS 08/01/2008    Medication List:  Allergies as of 04/08/2018      Reactions   Shrimp [shellfish Allergy]       Medication List         Accurate as of 04/08/18  9:04 PM. Always use your most recent med list.          EPINEPHrine 0.3 mg/0.3 mL Soaj injection Commonly known as:  EPI-PEN 1 MILLILITER(S) IM DAILY AS NEEDED DUAL PAK USE AS DIRECTED   escitalopram 10 MG tablet Commonly known as:  LEXAPRO Take 10 mg by mouth daily.   levothyroxine 75 MCG tablet Commonly known as:  SYNTHROID, LEVOTHROID Take 75 mcg by mouth daily before breakfast.       Birth History: non-contributory  Developmental History: non-contributory  Past Surgical History: Past Surgical History:  Procedure Laterality Date  . WISDOM TOOTH EXTRACTION       Family History: Family History  Problem Relation Age of Onset  . Hyperthyroidism Mother   . Healthy Father   . Hyperthyroidism Sister   . Healthy Brother      Social History: Nabilah lives at home with some friends. There are no animals inside or outside of the home. She does not have dust mite coverings on her bedding. She is a smoker. 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     Review of Systems: a 14-point review of systems is pertinent for what is mentioned in HPI.  Otherwise, all other systems were negative. Constitutional: negative other than that listed in the HPI Eyes: negative other than that listed in the HPI Ears, nose, mouth, throat, and face: negative other than that listed in the HPI Respiratory: negative other than that listed in the HPI Cardiovascular: negative other than that listed in the HPI Gastrointestinal: negative other than that listed in the HPI Genitourinary: negative other than that listed in the HPI Integument: negative other than that listed in the HPI Hematologic: negative other than that listed in the HPI Musculoskeletal: negative other than that listed in the HPI Neurological: negative other than that listed in the HPI Allergy/Immunologic: negative other than that listed in the HPI    Objective:   Blood  pressure 102/60, pulse 98, temperature 98.2 F (36.8 C), temperature source Oral, resp. rate 18, height 5' 4.5" (1.638 m), weight 198 lb (89.8 kg), SpO2 97 %. Body mass index is 33.46 kg/m.   Physical Exam:  General: Alert, interactive, in no acute distress. Somewhat flat affect.  Eyes: No conjunctival injection bilaterally, no discharge on the right, no discharge on the left and no Horner-Trantas dots present. PERRL bilaterally. EOMI without pain. No photophobia.  Ears: Right TM pearly gray with normal light reflex, Left TM pearly gray with normal light reflex, Right TM intact without perforation and Left TM intact without perforation.  Nose/Throat: External nose within normal limits and nasal crease present. Turbinates edematous and pale with clear discharge. Posterior oropharynx erythematous without cobblestoning in the posterior oropharynx. Tonsils 2+ without exudates.  Tongue without thrush. Neck: Supple without thyromegaly. Trachea midline. Adenopathy: no enlarged lymph nodes appreciated in the  anterior cervical, occipital, axillary, epitrochlear, inguinal, or popliteal regions. Lungs: Clear to auscultation without wheezing, rhonchi or rales. No increased work of breathing. CV: Normal S1/S2. No murmurs. Capillary refill <2 seconds.  Abdomen: Nondistended, nontender. No guarding or rebound tenderness. Bowel sounds present in all fields and hypoactive  Skin: Warm and dry, without lesions or rashes. There are some excoriations present on the bilateral arms. She is dermatographism present.  Extremities:  No clubbing, cyanosis or edema. Neuro:   Grossly intact. No focal deficits appreciated. Responsive to questions.  Diagnostic studies:   Allergy Studies:   Airborne Adult Perc - 04-23-18 1437    Time Antigen Placed  0245    Allergen Manufacturer  Waynette Buttery    Location  Back    Number of Test  59    Panel 1  Select    1. Control-Buffer 50% Glycerol  Negative    2. Control-Histamine 1  mg/ml  Negative    3. Albumin saline  Negative    4. Bahia  Negative    5. [place]  Negative    6. Johnson  Negative    7. Kentucky Blue  Negative    8. Meadow Fescue  Negative    9. Perennial Rye  Negative    10. Sweet Vernal  Negative    11. Timothy  Negative    12. Cocklebur  Negative    13. Burweed Marshelder  Negative    14. Ragweed, short  Negative    15. Ragweed, Giant  Negative    16. Plantain,  English  Negative    17. Lamb's Quarters  Negative    18. Sheep Sorrell  Negative    19. Rough Pigweed  Negative    20. Marsh Elder, Rough  Negative    21. Mugwort, Common  Negative    22. Ash mix  Negative    23. Birch mix  Negative    24. Beech American  Negative    25. Box, Elder  Negative    26. Cedar, red  Negative    27. Cottonwood, Guinea-Bissau  Negative    28. Elm mix  Negative    29. Hickory mix  Negative    30. Maple mix  Negative    31. Oak, Guinea-Bissau mix  Negative    32. Pecan Pollen  Negative    33. Pine mix  Negative    34. Sycamore Eastern  Negative    35. Walnut, Black Pollen  Negative    36. Alternaria alternata  Negative    37. Cladosporium Herbarum  Negative    38. Aspergillus mix  Negative    39. Penicillium mix  Negative    40. Bipolaris sorokiniana (Helminthosporium)  Negative    41. Drechslera spicifera (Curvularia)  Negative    42. Mucor plumbeus  Negative    43. Fusarium moniliforme  Negative    44. Aureobasidium pullulans (pullulara)  Negative    45. Rhizopus oryzae  Negative    46. Botrytis cinera  Negative    47. Epicoccum nigrum  Negative    48. Phoma betae  Negative    49. Candida Albicans  Negative    50. Trichophyton mentagrophytes  Negative    51. Mite, D Farinae  5,000 AU/ml  3+    52. Mite, D Pteronyssinus  5,000 AU/ml  3+    53. Cat Hair 10,000 BAU/ml  --   +/-   54.  Dog Epithelia  Negative    55. Mixed Feathers  Negative  56. Horse Epithelia  Negative    57. Cockroach, German  Negative    58. Mouse  Negative    59. Tobacco Leaf   Negative    Other  Negative   Banana    Food Perc - 04/08/18 1438    Time Antigen Placed  0245    Allergen Manufacturer  Waynette Buttery    Location  Back    Number of allergen test  10    Food  Select    1. Peanut  Negative    2. Soybean food  Negative    3. Wheat, whole  Negative    4. Sesame  Negative    5. Milk, cow  Negative    6. Egg White, chicken  Negative    7. Casein  Negative    8. Shellfish mix  Negative    9. Fish mix  Negative    10. Cashew  Negative        Allergy testing results were read and interpreted by myself, documented by clinical staff.       Malachi Bonds, MD [facility]

## 2018-04-08 NOTE — Patient Instructions (Addendum)
1. Chronic urticaria - Your history does not have any "red flags" such as fevers, joint pains, or permanent skin changes that would be concerning for a more serious cause of hives.  - We will get some labs to rule out serious causes of hives: complete blood count, tryptase level, CMP,  and alpha gal panel. - Chronic hives are often times a self limited process and will "burn themselves out" over 6-12 months, although this is not always the case.  - In the meantime, start suppressive dosing of antihistamines:   - Morning: Zyrtec (cetirizine) 10-20mg  (one to two tablets)  - Evening: Zyrtec (cetirizine) 10-20mg  (one to two tablets)  - If the above is not working, try adding: Zantac (ranitidine) 300mg  (two tablets) - You can change this dosing at home, decreasing the dose as needed or increasing the dosing as needed.  - If you are not tolerating the medications or are tired of taking them every day, we can start treatment with a monthly injectable medication called Xolair.   2. Perennial allergic rhinitis - Testing today showed: dust mites and cat - Avoidance measures provided. - Start taking: antihistamines as above (Allegra and Zyrtec). - You can use an extra dose of the antihistamine, if needed, for breakthrough symptoms.  - Consider nasal saline rinses 1-2 times daily to remove allergens from the nasal cavities as well as help with mucous clearance.  3. Return in about 6 weeks (around 05/20/2018).   Please inform us of any Emergency Department visits, hospitalizations, or changes in symptoms. Call us before going to the ED for breathing or allergy symptoms since we might be able to fit you in for a sick visit. Feel free to contact us anytime with any questions, problems, or concerns.  It was a pleasure to meet you today!  Websites that have reliable patient information: 1. American Academy of Asthma, Allergy, and Immunology: www.aaaai.org 2. Food Allergy Research and Education (FARE):  foodallergy.org 3. Mothers of Asthmatics: http://www.asthmacommunitynetwork.org 4. American College of Allergy, Asthma, and Immunology: MissingWeapons.ca   Make sure you are registered to vote! If you have moved or changed any of your contact information, you will need to get this updated before voting!    Control of House Dust Mite Allergen    House dust mites play a major role in allergic asthma and rhinitis.  They occur in environments with high humidity wherever human skin, the food for dust mites is found. High levels have been detected in dust obtained from mattresses, pillows, carpets, upholstered furniture, bed covers, clothes and soft toys.  The principal allergen of the house dust mite is found in its feces.  A gram of dust may contain 1,000 mites and 250,000 fecal particles.  Mite antigen is easily measured in the air during house cleaning activities.    1. Encase mattresses, including the box spring, and pillow, in an air tight cover.  Seal the zipper end of the encased mattresses with wide adhesive tape. 2. Wash the bedding in water of 130 degrees Farenheit weekly.  Avoid cotton comforters/quilts and flannel bedding: the most ideal bed covering is the dacron comforter. 3. Remove all upholstered furniture from the bedroom. 4. Remove carpets, carpet padding, rugs, and non-washable window drapes from the bedroom.  Wash drapes weekly or use plastic window coverings. 5. Remove all non-washable stuffed toys from the bedroom.  Wash stuffed toys weekly. 6. Have the room cleaned frequently with a vacuum cleaner and a damp dust-mop.  The patient should not  be in a room which is being cleaned and should wait 1 hour after cleaning before going into the room. 7. Close and seal all heating outlets in the bedroom.  Otherwise, the room will become filled with dust-laden air.  An electric heater can be used to heat the room. 8. Reduce indoor humidity to less than 50%.  Do not use a  humidifier.  Control of Dog or Cat Allergen  Avoidance is the best way to manage a dog or cat allergy. If you have a dog or cat and are allergic to dog or cats, consider removing the dog or cat from the home. If you have a dog or cat but don't want to find it a new home, or if your family wants a pet even though someone in the household is allergic, here are some strategies that may help keep symptoms at bay:  1. Keep the pet out of your bedroom and restrict it to only a few rooms. Be advised that keeping the dog or cat in only one room will not limit the allergens to that room. 2. Don't pet, hug or kiss the dog or cat; if you do, wash your hands with soap and water. 3. High-efficiency particulate air (HEPA) cleaners run continuously in a bedroom or living room can reduce allergen levels over time. 4. Regular use of a high-efficiency vacuum cleaner or a central vacuum can reduce allergen levels. 5. Giving your dog or cat a bath at least once a week can reduce airborne allergen.

## 2018-04-09 DIAGNOSIS — N760 Acute vaginitis: Secondary | ICD-10-CM | POA: Diagnosis not present

## 2018-04-09 DIAGNOSIS — Z6834 Body mass index (BMI) 34.0-34.9, adult: Secondary | ICD-10-CM | POA: Diagnosis not present

## 2018-04-13 DIAGNOSIS — N760 Acute vaginitis: Secondary | ICD-10-CM | POA: Diagnosis not present

## 2018-04-13 LAB — CBC WITH DIFFERENTIAL/PLATELET
BASOS ABS: 0.1 10*3/uL (ref 0.0–0.2)
BASOS: 1 %
EOS (ABSOLUTE): 0.1 10*3/uL (ref 0.0–0.4)
Eos: 1 %
Hematocrit: 41.8 % (ref 34.0–46.6)
Hemoglobin: 14.4 g/dL (ref 11.1–15.9)
IMMATURE GRANS (ABS): 0 10*3/uL (ref 0.0–0.1)
Immature Granulocytes: 0 %
LYMPHS: 27 %
Lymphocytes Absolute: 2.7 10*3/uL (ref 0.7–3.1)
MCH: 31 pg (ref 26.6–33.0)
MCHC: 34.4 g/dL (ref 31.5–35.7)
MCV: 90 fL (ref 79–97)
MONOS ABS: 0.7 10*3/uL (ref 0.1–0.9)
Monocytes: 7 %
NEUTROS ABS: 6.5 10*3/uL (ref 1.4–7.0)
NEUTROS PCT: 64 %
PLATELETS: 298 10*3/uL (ref 150–450)
RBC: 4.65 x10E6/uL (ref 3.77–5.28)
RDW: 12 % — AB (ref 12.3–15.4)
WBC: 10 10*3/uL (ref 3.4–10.8)

## 2018-04-13 LAB — CMP14+EGFR
ALT: 24 IU/L (ref 0–32)
AST: 18 IU/L (ref 0–40)
Albumin/Globulin Ratio: 1.5 (ref 1.2–2.2)
Albumin: 4.1 g/dL (ref 3.5–5.5)
Alkaline Phosphatase: 65 IU/L (ref 39–117)
BUN/Creatinine Ratio: 9 (ref 9–23)
BUN: 7 mg/dL (ref 6–20)
Bilirubin Total: <0.2 mg/dL (ref 0.0–1.2)
CALCIUM: 9.2 mg/dL (ref 8.7–10.2)
CHLORIDE: 102 mmol/L (ref 96–106)
CO2: 22 mmol/L (ref 20–29)
Creatinine, Ser: 0.76 mg/dL (ref 0.57–1.00)
GFR, EST AFRICAN AMERICAN: 129 mL/min/{1.73_m2} (ref >59)
GFR, EST NON AFRICAN AMERICAN: 112 mL/min/{1.73_m2} (ref >59)
Globulin, Total: 2.7 g/dL (ref 1.5–4.5)
Glucose: 104 mg/dL — ABNORMAL HIGH (ref 65–99)
POTASSIUM: 4.3 mmol/L (ref 3.5–5.2)
SODIUM: 138 mmol/L (ref 134–144)
TOTAL PROTEIN: 6.8 g/dL (ref 6.0–8.5)

## 2018-04-13 LAB — ALPHA-GAL PANEL
Beef (Bos spp) IgE: <0.1 kU/L (ref <0.35)
Class Interpretation: 0
Class Interpretation: 0
PORK CLASS INTERPRETATION: 0
Pork (Sus spp) IgE: <0.1 kU/L (ref <0.35)

## 2018-04-13 LAB — TRYPTASE: TRYPTASE: 8.1 ug/L (ref 2.2–13.2)

## 2018-04-13 LAB — ALLERGEN, CUMIN SEED

## 2018-04-13 LAB — ALLERGEN,GRN PEPPER,PAPRIKA,F218

## 2018-04-24 ENCOUNTER — Telehealth: Payer: Self-pay | Admitting: Allergy & Immunology

## 2018-04-24 NOTE — Telephone Encounter (Signed)
Patient returning call for results 

## 2018-04-24 NOTE — Telephone Encounter (Signed)
Called patient back and informed her of lab results. Patient will continue to keep journal of food triggers.

## 2018-05-14 IMAGING — DX DG CHEST 2V
2 series · 2 of 2 positions shown · non-contrast
Comparison: 09/06/2015 CXR report

CLINICAL DATA: Dyspnea and tightness in throat.

EXAM:
CHEST  2 VIEW

[chest pa]
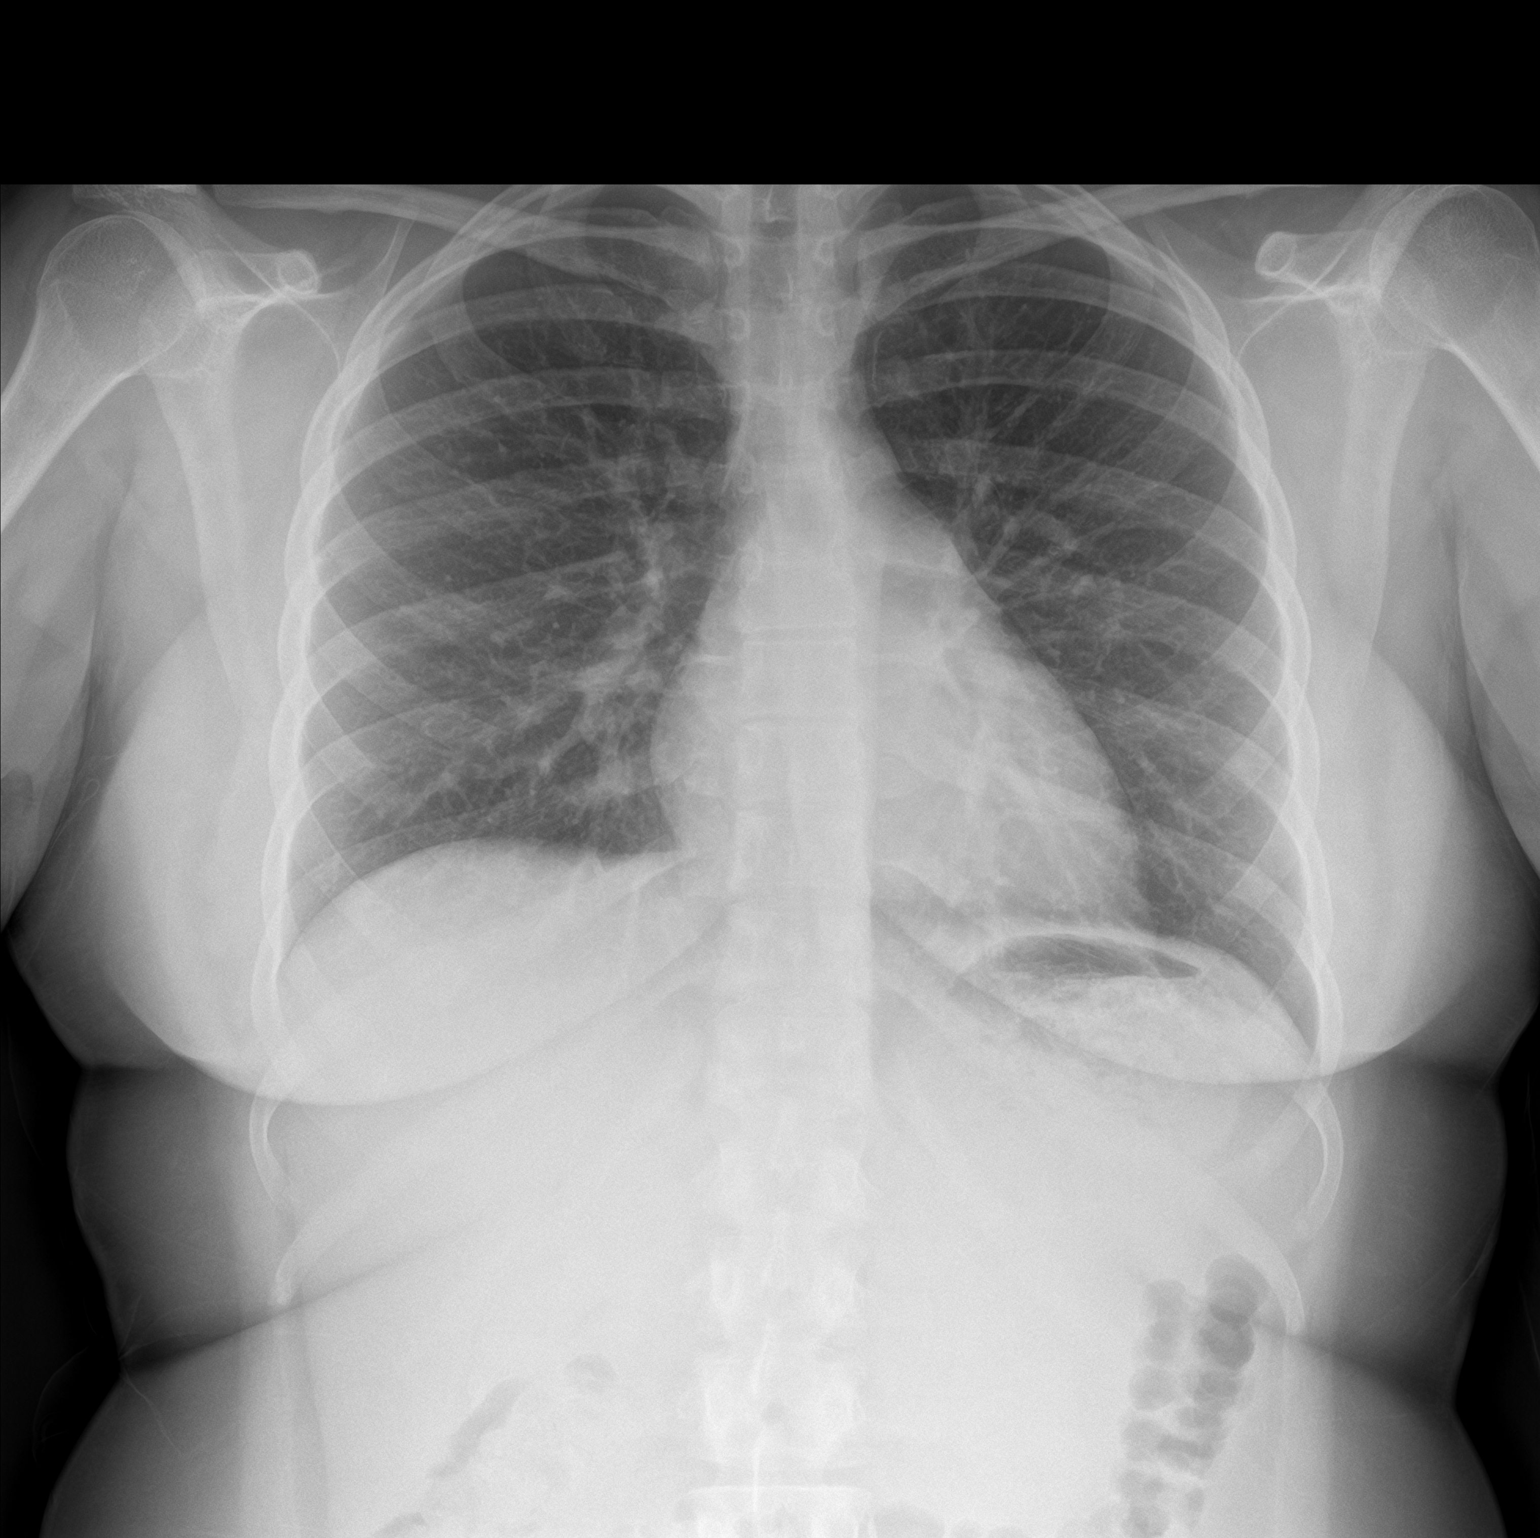

[chest lat]
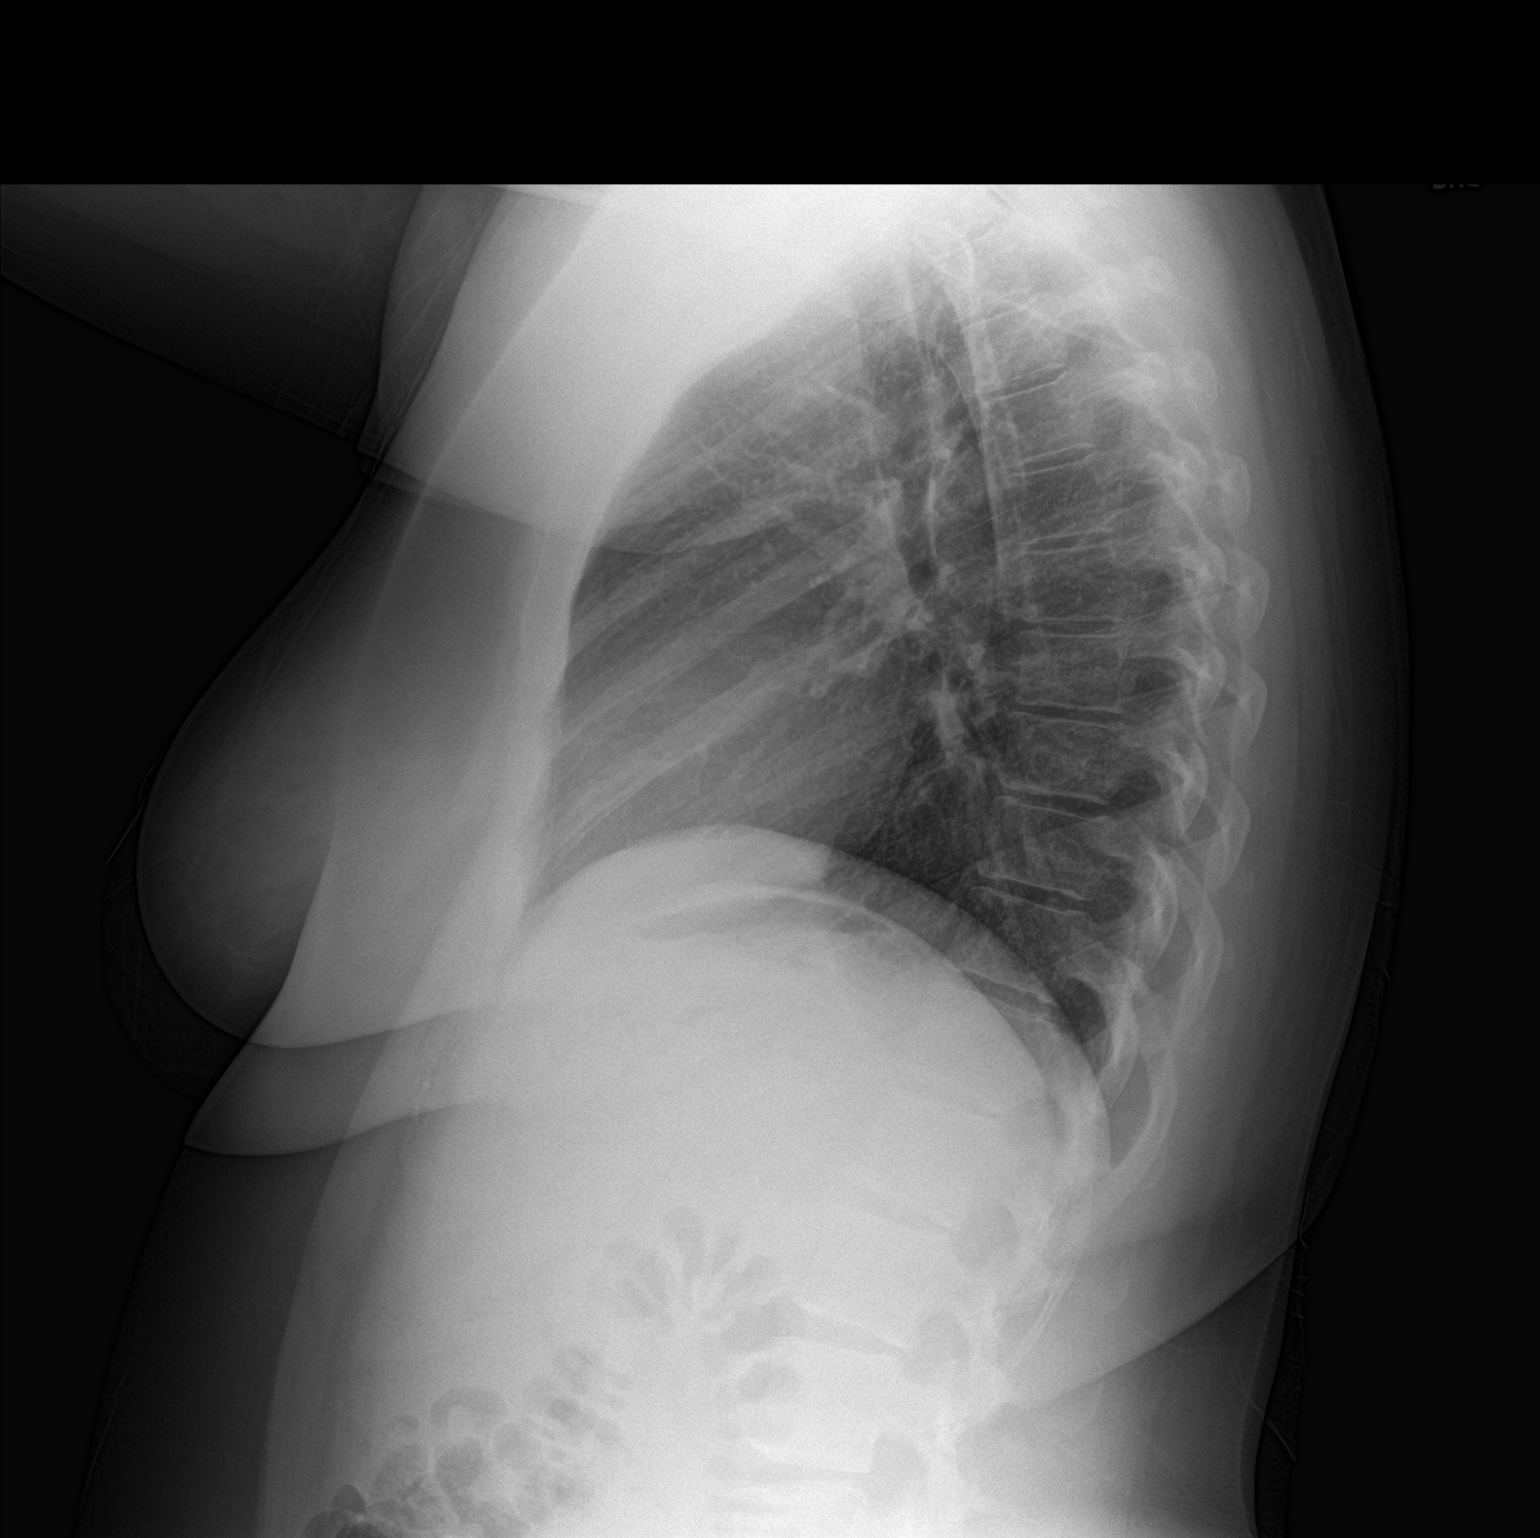

[2 of 2 positions shown; findings below may reference images not displayed]

FINDINGS: The heart size and mediastinal contours are within normal limits.
Both lungs are clear. The visualized skeletal structures are
unremarkable.
IMPRESSION: No active cardiopulmonary disease.

## 2018-06-15 DIAGNOSIS — L509 Urticaria, unspecified: Secondary | ICD-10-CM | POA: Diagnosis not present

## 2018-06-15 DIAGNOSIS — T7840XA Allergy, unspecified, initial encounter: Secondary | ICD-10-CM | POA: Diagnosis not present

## 2018-09-28 DIAGNOSIS — H109 Unspecified conjunctivitis: Secondary | ICD-10-CM | POA: Diagnosis not present

## 2018-09-28 DIAGNOSIS — Z6834 Body mass index (BMI) 34.0-34.9, adult: Secondary | ICD-10-CM | POA: Diagnosis not present

## 2020-02-07 ENCOUNTER — Ambulatory Visit: Payer: 59 | Admitting: *Deleted

## 2020-02-07 LAB — POC COVID19 BINAXNOW: SARS Coronavirus 2 Ag: NEGATIVE
